# Patient Record
Sex: Female | Born: 1995 | Race: White | Hispanic: No | Marital: Single | Smoking: Never smoker
Health system: Southern US, Community
[De-identification: ages and names within clinical notes are randomized; demographics above are authoritative.]

---

## 2015-02-24 ENCOUNTER — Emergency Department (HOSPITAL_COMMUNITY): Payer: Managed Care, Other (non HMO)

## 2015-02-24 ENCOUNTER — Emergency Department (HOSPITAL_COMMUNITY)
Admission: EM | Admit: 2015-02-24 | Discharge: 2015-02-24 | Disposition: A | Discharge status: Home / Self Care | Payer: Managed Care, Other (non HMO) | Attending: Emergency Medicine | Admitting: Emergency Medicine

## 2015-02-24 ENCOUNTER — Encounter (HOSPITAL_COMMUNITY): Payer: Self-pay | Admitting: *Deleted

## 2015-02-24 DIAGNOSIS — M436 Torticollis: Secondary | ICD-10-CM | POA: Insufficient documentation

## 2015-02-24 DIAGNOSIS — M542 Cervicalgia: Secondary | ICD-10-CM | POA: Diagnosis present

## 2015-02-24 MED ORDER — CYCLOBENZAPRINE HCL 10 MG PO TABS: 10.0000 mg | ORAL_TABLET | Freq: Three times a day (TID) | ORAL | Status: AC | PRN

## 2015-02-24 MED ORDER — CYCLOBENZAPRINE HCL 10 MG PO TABS
10.0000 mg | ORAL_TABLET | Freq: Once | ORAL | Status: AC
  Administered 2015-02-24: 10 mg via ORAL
  Filled 2015-02-24: qty 1

## 2015-02-24 MED ORDER — KETOROLAC TROMETHAMINE 60 MG/2ML IM SOLN
60.0000 mg | Freq: Once | INTRAMUSCULAR | Status: AC
  Administered 2015-02-24: 60 mg via INTRAMUSCULAR
  Filled 2015-02-24: qty 2

## 2015-02-24 NOTE — ED Notes (Signed)
Pt in c/o neck pain, states she woke up with this pain and then tonight while at softball she was swinging a bat and felt something pop, c/o heaviness and tingling to right arm, reports swelling to back of her neck on the right side, c-collar applied in triage, ambulatory

## 2015-02-24 NOTE — Discharge Instructions (Signed)

## 2015-02-24 NOTE — ED Provider Notes (Signed)
CSN: [id number]     Arrival date & time 02/24/15  2107 History   First MD Initiated Contact with Patient 02/24/15 2143     Chief Complaint  Patient presents with  . Neck Pain     (Consider location/radiation/quality/duration/timing/severity/associated sxs/prior Treatment) Patient is a 19 y.o. female presenting with neck injury. The history is provided by the patient.  Neck Injury This is a new problem. The current episode started today. The problem occurs constantly. The problem has been gradually worsening. Associated symptoms include neck pain. Pertinent negatives include no abdominal pain, congestion, coughing, fever, nausea, rash, visual change or weakness. The symptoms are aggravated by twisting. She has tried nothing for the symptoms. The treatment provided no relief.    History reviewed. No pertinent past medical history. History reviewed. No pertinent past surgical history. History reviewed. No pertinent family history. History  Substance Use Topics  . Smoking status: Never Smoker   . Smokeless tobacco: Not on file  . Alcohol Use: Not on file   OB History    No data available     Review of Systems  Constitutional: Negative for fever.  HENT: Negative for congestion.   Respiratory: Negative for cough.   Gastrointestinal: Negative for nausea and abdominal pain.  Musculoskeletal: Positive for neck pain.  Skin: Negative for color change, pallor, rash and wound.  Neurological: Negative for weakness.  All other systems reviewed and are negative.     Allergies  Keflex  Home Medications   Prior to Admission medications   Medication Sig Start Date End Date Taking? Authorizing Provider  cyclobenzaprine (FLEXERIL) 10 MG tablet Take 1 tablet (10 mg total) by mouth 3 (three) times daily as needed for muscle spasms. 02/24/15   Dorna Leitz, MD   BP 110/60 mmHg  Pulse 90  Temp(Src) 98 F (36.7 C) (Oral)  Resp 19  SpO2 100%  LMP 02/13/2015 Physical Exam  Constitutional:  She appears well-developed and well-nourished. No distress.  HENT:  Head: Normocephalic and atraumatic.  Mouth/Throat: No oropharyngeal exudate.  Eyes: EOM are normal. Pupils are equal, round, and reactive to light.  Neck: Normal range of motion. Neck supple. Muscular tenderness (Right) present. No rigidity. No erythema present. No Brudzinski's sign and no Kernig's sign noted.  Spasm of right neck. Head held straight forward. Pain with range of motion to left.  Cardiovascular: Normal rate, regular rhythm, normal heart sounds and intact distal pulses.   Pulmonary/Chest: Effort normal and breath sounds normal. No respiratory distress. She has no wheezes. She has no rales.  Abdominal: She exhibits no distension. There is no tenderness.  Musculoskeletal: Normal range of motion. She exhibits no edema or tenderness.  Lymphadenopathy:    She has no cervical adenopathy.  Skin: Skin is warm and dry. No rash noted. She is not diaphoretic.  Psychiatric: She has a normal mood and affect. Her behavior is normal. Judgment and thought content normal.  Nursing note and vitals reviewed.   ED Course  Procedures (including critical care time) Labs Review Labs Reviewed - No data to display  Imaging Review Dg Cervical Spine Complete  02/24/2015   CLINICAL DATA:  19 year old female with neck injury tonight with cervical pain and tingling to right arm.  EXAM: CERVICAL SPINE  4+ VIEWS  COMPARISON:  None.  FINDINGS: There is no evidence of cervical spine fracture or prevertebral soft tissue swelling. Alignment is normal. No other significant bone abnormalities are identified.  IMPRESSION: Negative cervical spine radiographs.   Electronically Signed  By: Harmon PierJeffrey  Hu M.D.   On: 02/24/2015 22:24     EKG Interpretation None      MDM   Final diagnoses:  Torticollis, acute   19 year old female presents with torticollis. Right-sided neck pain after waking up this morning. It worsened after swinging a bat at  softball at practice earlier today. On exam spasming of the right neck noted. No midline tenderness. Plain films obtained from triage are negative. History completely consistent with torticollis of acute origin. No signs of retropharyngeal or peritonsillar abscess. No fevers or meningeal signs. Will treat with anti-inflammatories and muscle relaxers and student health follow-up.  Dorna LeitzAlex Sulay Brymer, MD 02/25/15 [id number]  Richardean Canalavid H Yao, MD 02/25/15 1213

## 2016-04-04 ENCOUNTER — Emergency Department (HOSPITAL_COMMUNITY)
Admission: EM | Admit: 2016-04-04 | Discharge: 2016-04-04 | Disposition: A | Discharge status: Home / Self Care | Payer: Managed Care, Other (non HMO) | Attending: Emergency Medicine | Admitting: Emergency Medicine

## 2016-04-04 ENCOUNTER — Emergency Department (HOSPITAL_COMMUNITY): Payer: Managed Care, Other (non HMO)

## 2016-04-04 ENCOUNTER — Encounter (HOSPITAL_COMMUNITY): Payer: Self-pay

## 2016-04-04 DIAGNOSIS — J029 Acute pharyngitis, unspecified: Secondary | ICD-10-CM | POA: Insufficient documentation

## 2016-04-04 DIAGNOSIS — R52 Pain, unspecified: Secondary | ICD-10-CM

## 2016-04-04 DIAGNOSIS — R1011 Right upper quadrant pain: Secondary | ICD-10-CM | POA: Diagnosis present

## 2016-04-04 DIAGNOSIS — Z3202 Encounter for pregnancy test, result negative: Secondary | ICD-10-CM | POA: Diagnosis not present

## 2016-04-04 DIAGNOSIS — N390 Urinary tract infection, site not specified: Secondary | ICD-10-CM | POA: Insufficient documentation

## 2016-04-04 DIAGNOSIS — R1010 Upper abdominal pain, unspecified: Secondary | ICD-10-CM

## 2016-04-04 LAB — LIPASE, BLOOD: Lipase: 32 U/L (ref 11–51)

## 2016-04-04 LAB — COMPREHENSIVE METABOLIC PANEL
ALK PHOS: 61 U/L (ref 38–126)
ALT: 18 U/L (ref 14–54)
ANION GAP: 9 (ref 5–15)
AST: 24 U/L (ref 15–41)
Albumin: 4 g/dL (ref 3.5–5.0)
BILIRUBIN TOTAL: 0.4 mg/dL (ref 0.3–1.2)
BUN: 11 mg/dL (ref 6–20)
CALCIUM: 9.2 mg/dL (ref 8.9–10.3)
CO2: 22 mmol/L (ref 22–32)
Chloride: 109 mmol/L (ref 101–111)
Creatinine, Ser: 0.75 mg/dL (ref 0.44–1.00)
GFR calc Af Amer: >60 mL/min (ref >60)
GFR calc non Af Amer: >60 mL/min (ref >60)
Glucose, Bld: 94 mg/dL (ref 65–99)
POTASSIUM: 4.4 mmol/L (ref 3.5–5.1)
Sodium: 140 mmol/L (ref 135–145)
TOTAL PROTEIN: 6.5 g/dL (ref 6.5–8.1)

## 2016-04-04 LAB — I-STAT BETA HCG BLOOD, ED (MC, WL, AP ONLY)

## 2016-04-04 LAB — CBC
HEMATOCRIT: 37.5 % (ref 36.0–46.0)
HEMOGLOBIN: 11.9 g/dL — AB (ref 12.0–15.0)
MCH: 26.9 pg (ref 26.0–34.0)
MCHC: 31.7 g/dL (ref 30.0–36.0)
MCV: 84.8 fL (ref 78.0–100.0)
Platelets: 172 10*3/uL (ref 150–400)
RBC: 4.42 MIL/uL (ref 3.87–5.11)
RDW: 13.3 % (ref 11.5–15.5)
WBC: 5.7 10*3/uL (ref 4.0–10.5)

## 2016-04-04 LAB — URINE MICROSCOPIC-ADD ON

## 2016-04-04 LAB — URINALYSIS, ROUTINE W REFLEX MICROSCOPIC
Bilirubin Urine: NEGATIVE
Glucose, UA: NEGATIVE mg/dL
KETONES UR: 15 mg/dL — AB
Nitrite: NEGATIVE
PH: 6.5 (ref 5.0–8.0)
Protein, ur: NEGATIVE mg/dL
Specific Gravity, Urine: 1.016 (ref 1.005–1.030)

## 2016-04-04 LAB — MONONUCLEOSIS SCREEN: MONO SCREEN: NEGATIVE

## 2016-04-04 MED ORDER — NITROFURANTOIN MONOHYD MACRO 100 MG PO CAPS: 100.0000 mg | ORAL_CAPSULE | Freq: Two times a day (BID) | ORAL | Status: AC

## 2016-04-04 MED ORDER — SODIUM CHLORIDE 0.9 % IV BOLUS (SEPSIS)
1000.0000 mL | Freq: Once | INTRAVENOUS | Status: AC
  Administered 2016-04-04: 1000 mL via INTRAVENOUS

## 2016-04-04 NOTE — ED Provider Notes (Signed)
CSN: [id number]     Arrival date & time 04/04/16  1726 History   First MD Initiated Contact with Patient 04/04/16 1846     Chief Complaint  Patient presents with  . Abdominal Pain  . Sore Throat     (Consider location/radiation/quality/duration/timing/severity/associated sxs/prior Treatment) HPI  20 year old female presents with right-sided abdominal pain. Is mostly right upper quadrant. Has been ongoing for the past 5 days. Started when she started having a sore throat and change in voice. Sore throat is still present. She has also been having nausea with one episode of vomiting when the symptoms started. Is currently on Zofran for the nausea. Pain does worsen in her abdomen when eating. No fevers. No back pain. Pain also worsens whenever she is urinating. Denies any vaginal discharge. Is currently on her menstrual cycle.  History reviewed. No pertinent past medical history. History reviewed. No pertinent past surgical history. No family history on file. Social History  Substance Use Topics  . Smoking status: Never Smoker   . Smokeless tobacco: None  . Alcohol Use: None   OB History    No data available     Review of Systems  Constitutional: Negative for fever.  HENT: Positive for sore throat and voice change.   Gastrointestinal: Positive for nausea and abdominal pain. Negative for vomiting and diarrhea.  Genitourinary: Positive for dysuria.  Musculoskeletal: Negative for back pain.  All other systems reviewed and are negative.     Allergies  Keflex  Home Medications   Prior to Admission medications   Medication Sig Start Date End Date Taking? Authorizing Provider  cyclobenzaprine (FLEXERIL) 10 MG tablet Take 1 tablet (10 mg total) by mouth 3 (three) times daily as needed for muscle spasms. 02/24/15   Dorna LeitzAlex Nickle, MD   BP 116/76 mmHg  Pulse 71  Temp(Src) 98.6 F (37 C) (Oral)  Resp 18  SpO2 100%  LMP 04/02/2016 Physical Exam  Constitutional: She is oriented to  person, place, and time. She appears well-developed and well-nourished.  HENT:  Head: Normocephalic and atraumatic.  Right Ear: External ear normal.  Left Ear: External ear normal.  Nose: Nose normal.  Mouth/Throat: Uvula is midline and oropharynx is clear and moist. No trismus in the jaw. No oropharyngeal exudate, posterior oropharyngeal edema, posterior oropharyngeal erythema or tonsillar abscesses.  Raspy voice  Eyes: Right eye exhibits no discharge. Left eye exhibits no discharge.  Cardiovascular: Normal rate, regular rhythm and normal heart sounds.   Pulmonary/Chest: Effort normal and breath sounds normal.  Abdominal: Soft. There is tenderness (mild) in the right upper quadrant. There is no rigidity, no guarding, no tenderness at McBurney's point and negative Murphy's sign.  Neurological: She is alert and oriented to person, place, and time.  Skin: Skin is warm and dry.  Nursing note and vitals reviewed.   ED Course  Procedures (including critical care time) Labs Review Labs Reviewed  CBC - Abnormal; Notable for the following:    Hemoglobin 11.9 (*)    All other components within normal limits  URINALYSIS, ROUTINE W REFLEX MICROSCOPIC (NOT [facility]) - Abnormal; Notable for the following:    Hgb urine dipstick SMALL (*)    Ketones, ur 15 (*)    Leukocytes, UA SMALL (*)    All other components within normal limits  URINE MICROSCOPIC-ADD ON - Abnormal; Notable for the following:    Squamous Epithelial / LPF 0-5 (*)    Bacteria, UA RARE (*)    All other components within normal  limits  LIPASE, BLOOD  COMPREHENSIVE METABOLIC PANEL  MONONUCLEOSIS SCREEN  I-STAT BETA HCG BLOOD, ED ([facility], [facility], [facility] ONLY)    Imaging Review US Abdomen Complete  04/04/2016  CLINICAL DATA:  Right abdominal pain for 5 days EXAM: ABDOMEN ULTRASOUND COMPLETE COMPARISON:  None. FINDINGS: Gallbladder: No gallbladder wall thickening. Polyp is noted measuring 3.5 mm. Common bile duct: Diameter: 2.7 mm. Liver: No  focal lesion identified. Within normal limits in parenchymal echogenicity. IVC: No abnormality visualized. Pancreas: Visualized portion unremarkable. Spleen: Size and appearance within normal limits. Right Kidney: Length: 11 cm. Echogenicity within normal limits. No mass or hydronephrosis visualized. Left Kidney: Length: 11 cm. Echogenicity within normal limits. No mass or hydronephrosis visualized. Abdominal aorta: No aneurysm visualized. Other findings: None. IMPRESSION: 1. No acute findings. 2. Gallbladder polyp. Electronically Signed   By: Signa Kell M.D.   On: 04/04/2016 20:16   I have personally reviewed and evaluated these images and lab results as part of my medical decision-making.   EKG Interpretation None      MDM   Final diagnoses:  Upper abdominal pain  UTI (lower urinary tract infection)    Patient has slowly worsening upper abdominal pain for about 5 days. Ultrasound is negative. LFTs unremarkable. Unclear exactly why she is having pain. However there is no lower abdominal tenderness and does have very low suspicion for torsion, renal stone, appendicitis, or other acute emergency. She is not vomiting. Her voice is hoarse from her recent laryngitis but no shortness of breath or trouble swallowing. Likely has been having a viral syndrome given her nausea as well. At this point we have discussed not doing a CT scan given radiation risks with expectation of low yield results. Discussed returning if symptoms worsen. Otherwise follow-up with PCP. She does have some dysuria and with a equivocal urine will treat with antibiotics.    Pricilla Loveless, MD 04/05/16 [phone]

## 2016-04-04 NOTE — ED Notes (Signed)
Patient diagnosed with laryngitis on Tuesday and complains of ongoing sore throat.  Now has developed right sided abdominal pain with general malaise x 2 days. Sent from urgent care to be evaluated for mono and /or appendicitis

## 2016-04-04 NOTE — ED Notes (Signed)
EDP at bedside  

## 2016-05-18 IMAGING — DX DG CERVICAL SPINE COMPLETE 4+V
5 series · 5 of 5 positions shown · non-contrast
Comparison: None.

CLINICAL DATA: 18-year-old female with neck injury tonight with
cervical pain and tingling to right arm.

EXAM:
CERVICAL SPINE  4+ VIEWS

[c-spine lat]
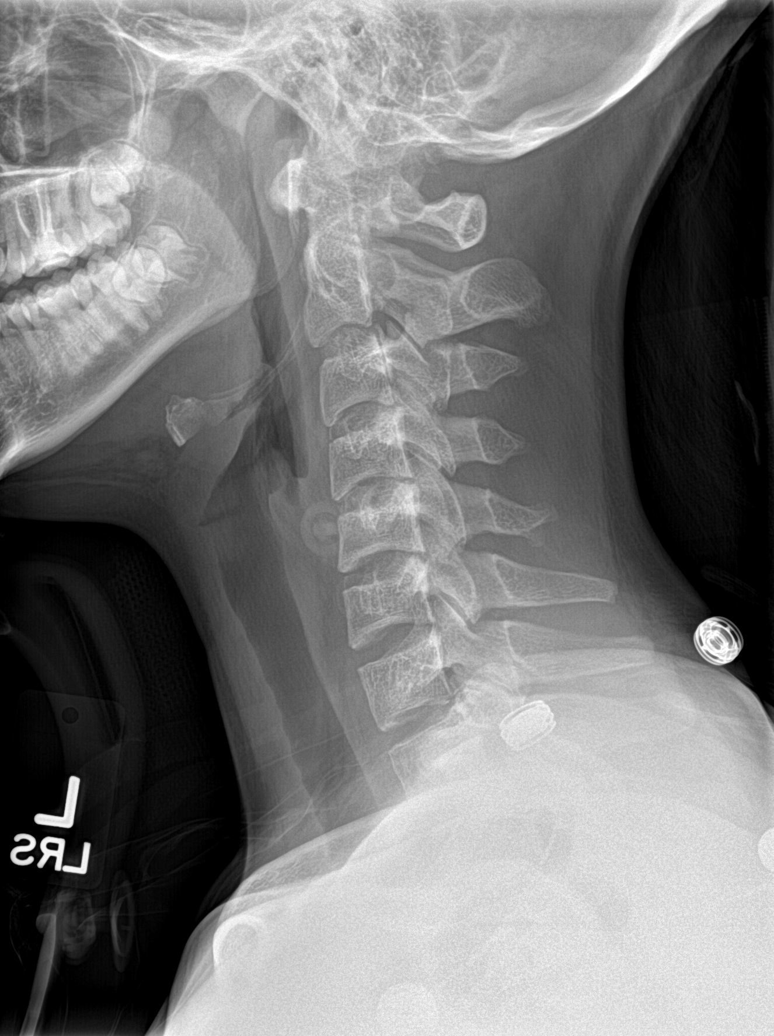

[c-spine obl (1 of 2)]
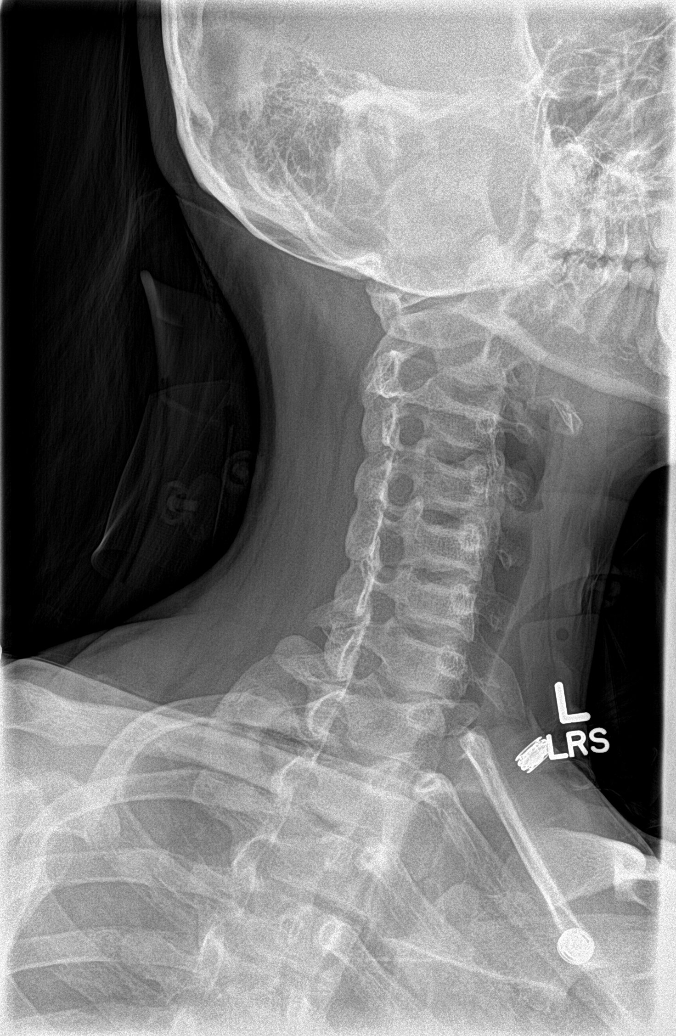

[c-spine obl (2 of 2)]
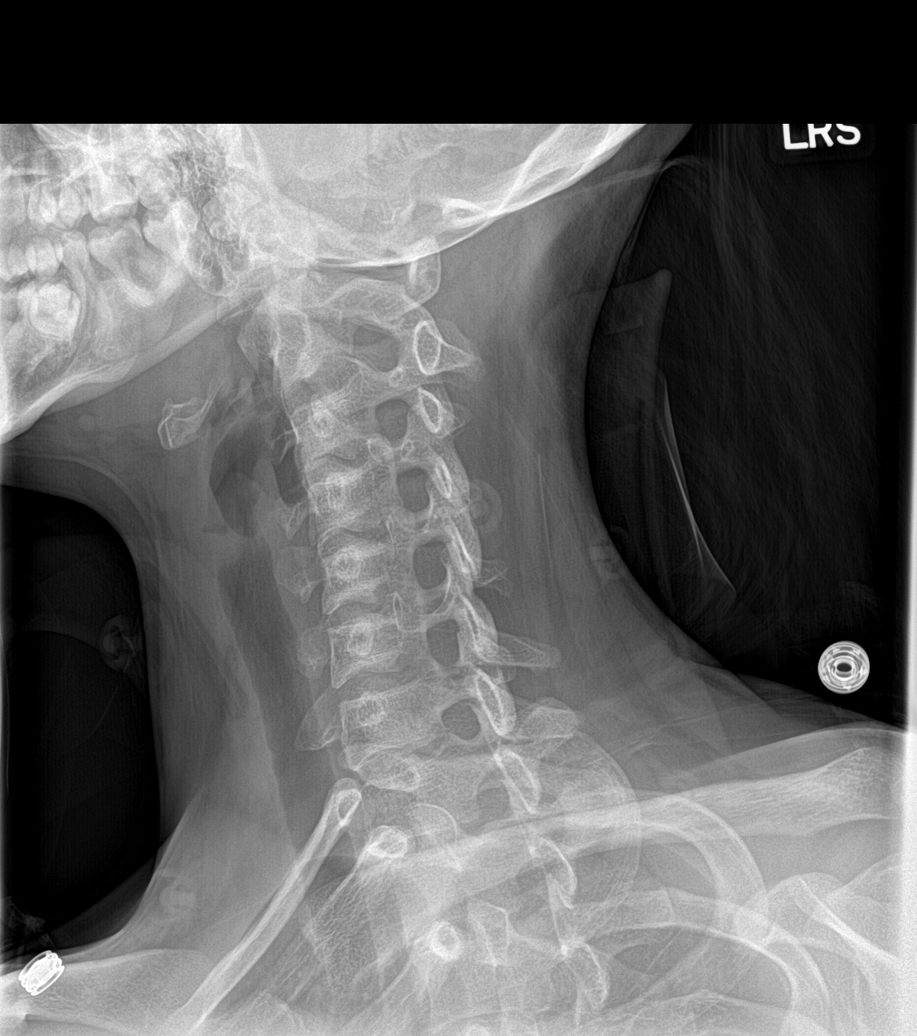

[c-spine ap]
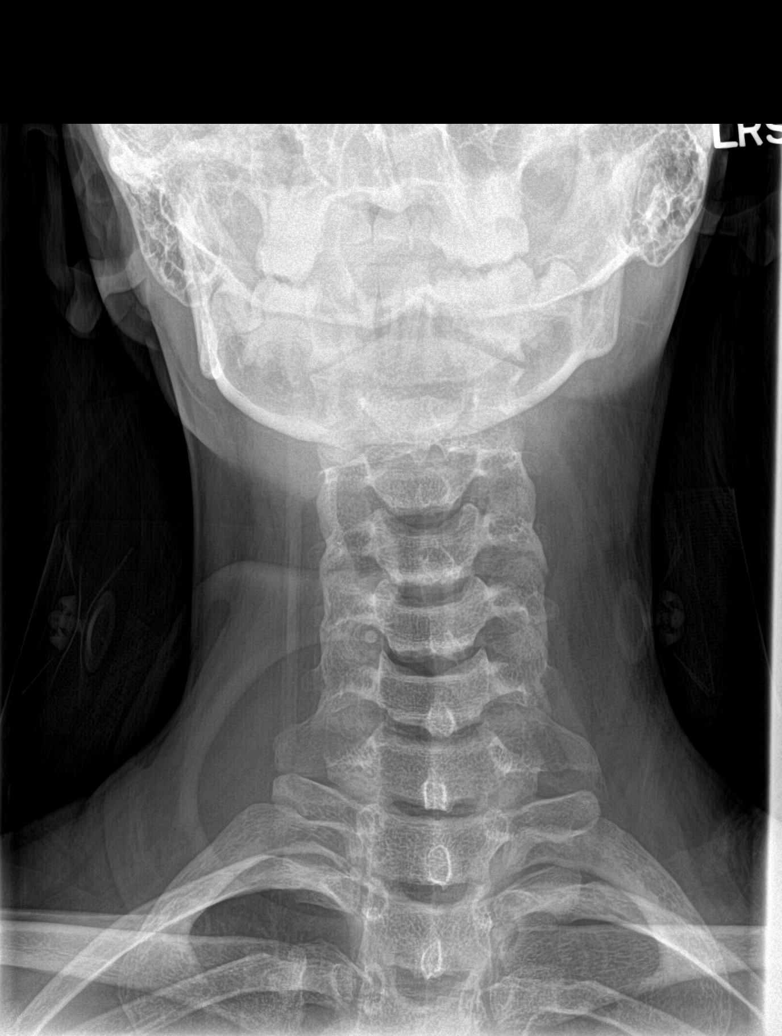

[c-spine open mouth]
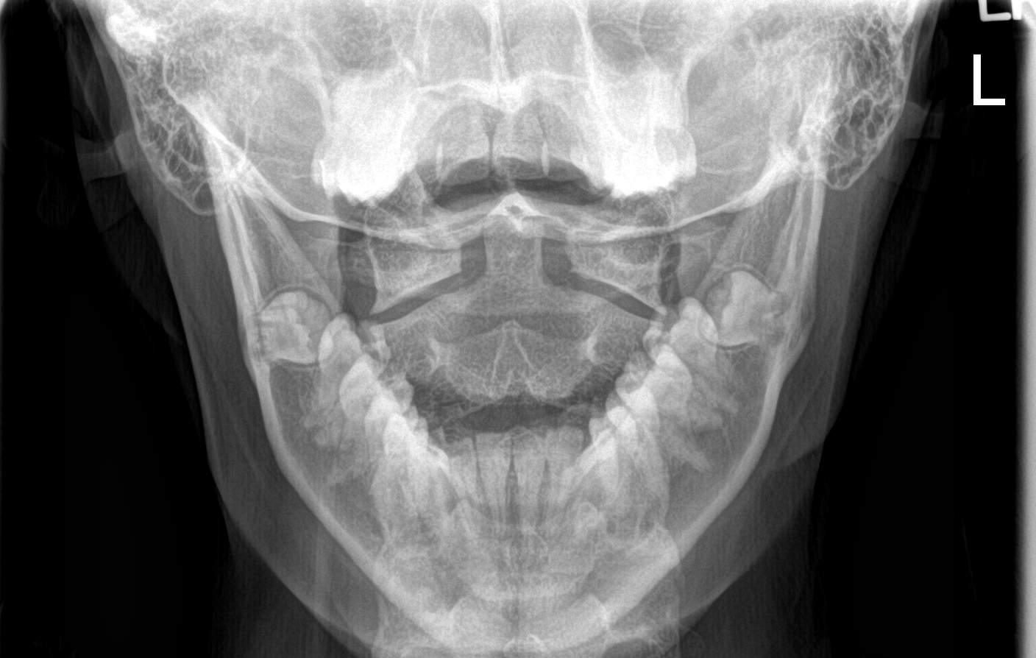

[5 of 5 positions shown; findings below may reference images not displayed]

FINDINGS: There is no evidence of cervical spine fracture or prevertebral soft
tissue swelling. Alignment is normal. No other significant bone
abnormalities are identified.
IMPRESSION: Negative cervical spine radiographs.

## 2017-06-08 IMAGING — US US ABDOMEN COMPLETE
1 series · 14 of 25 positions shown · non-contrast
Comparison: None.

CLINICAL DATA: Right abdominal pain for 5 days

EXAM:
ABDOMEN ULTRASOUND COMPLETE

[Series 1: us abdomen complete · 0.12mm/px · 14 of 76 slices shown]
[im 1/76]
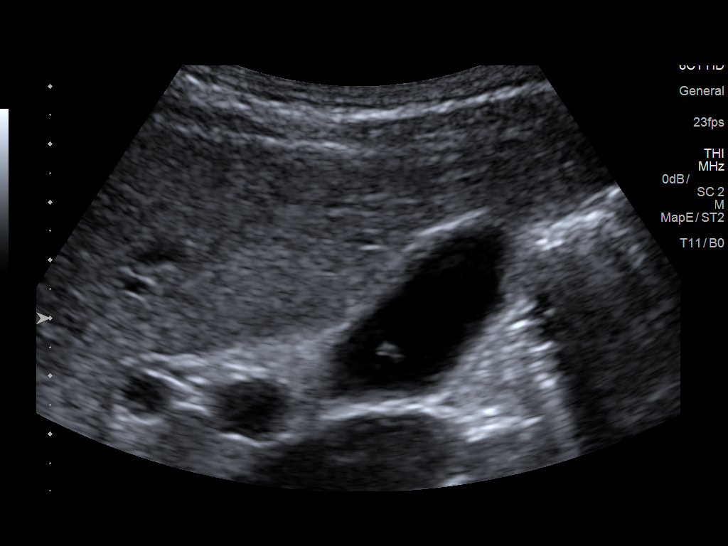
[im 7/76]
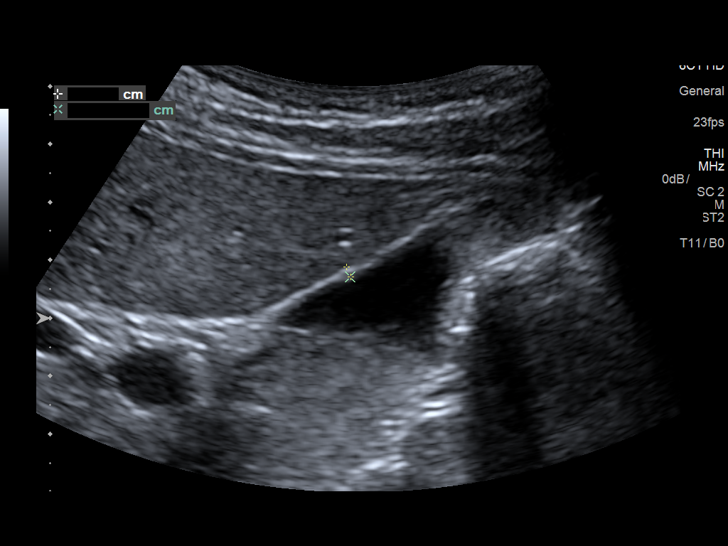
[im 13/76]
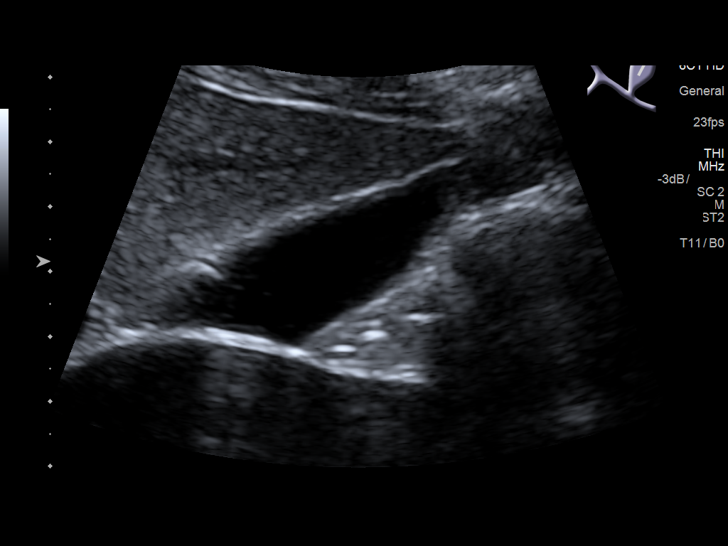
[im 19/76]
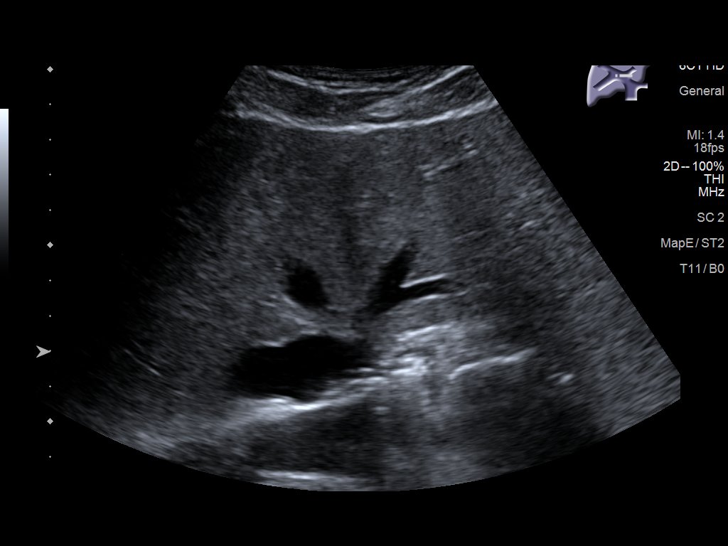
[im 26/76]
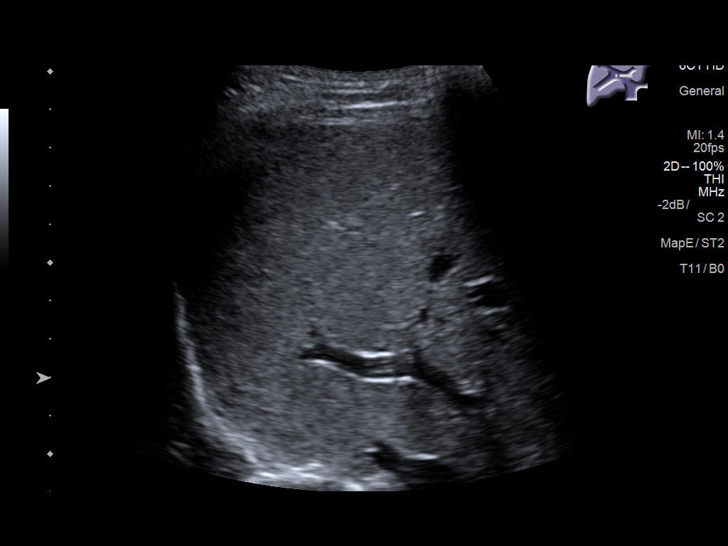
[im 29/76]
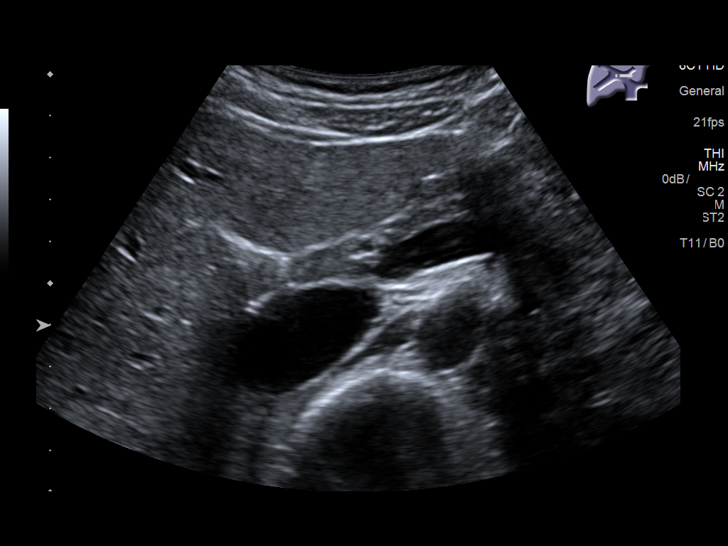
[im 35/76]
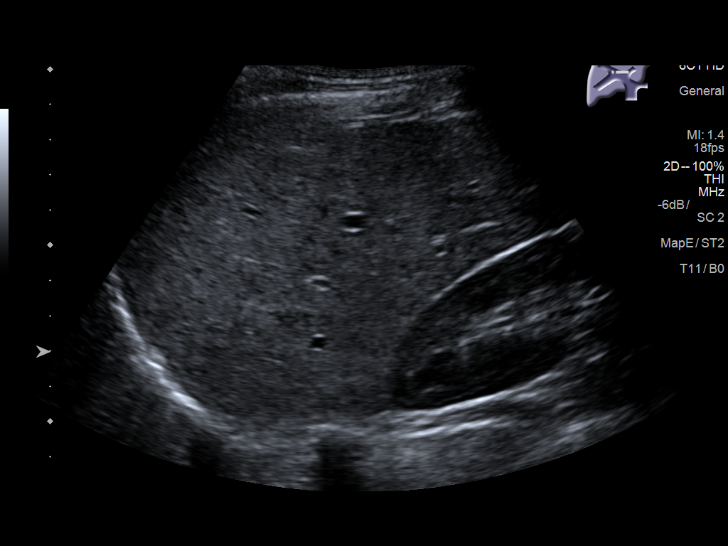
[im 41/76]
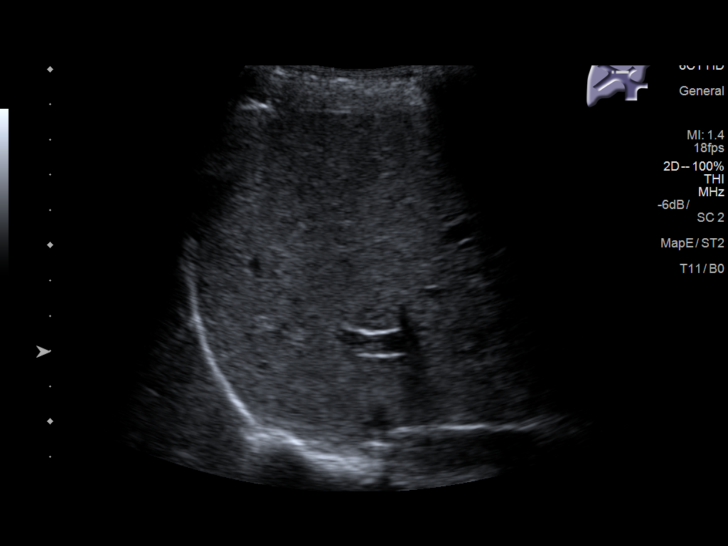
[im 47/76]
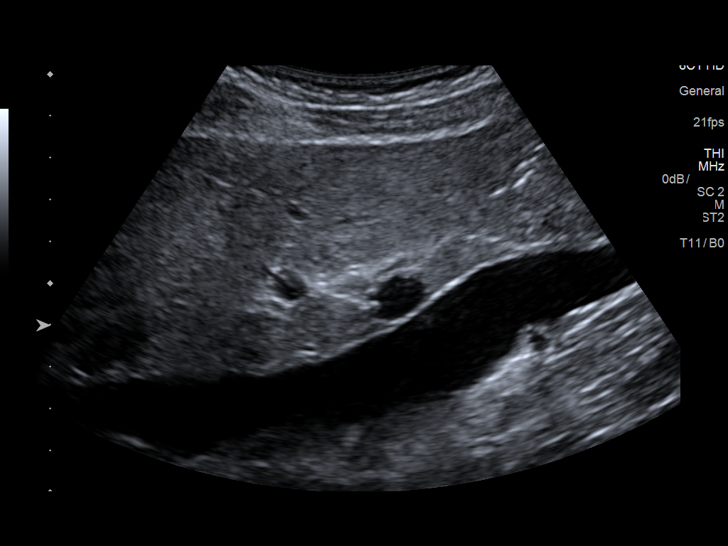
[im 51/76]
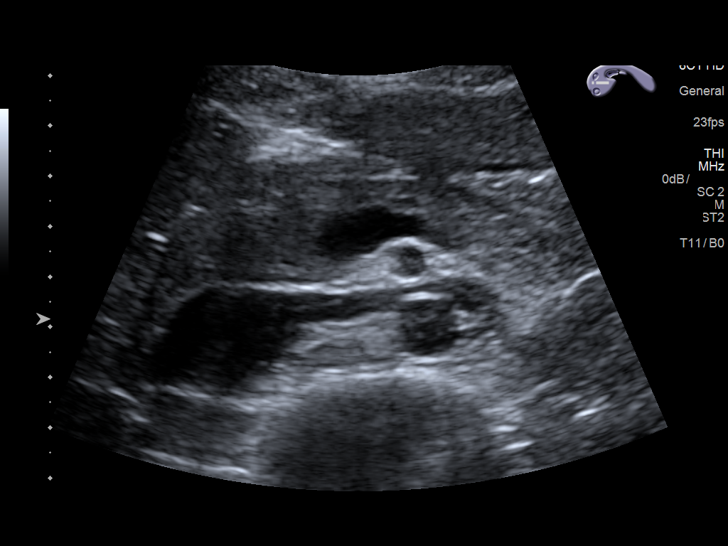
[im 57/76]
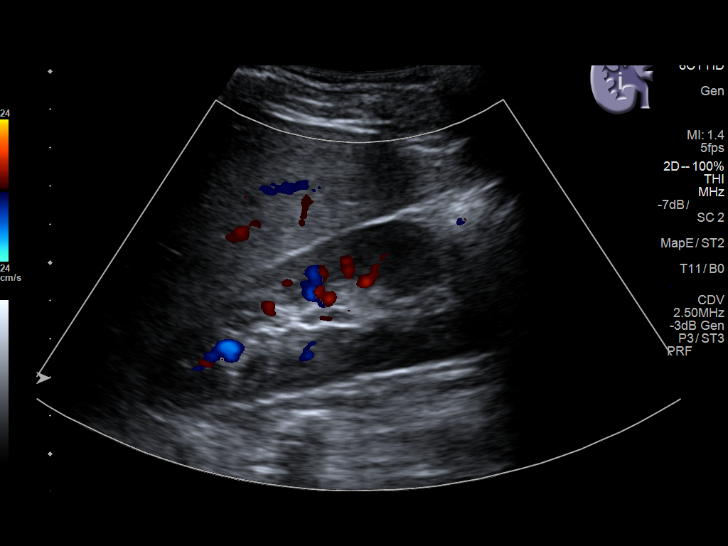
[im 63/76]
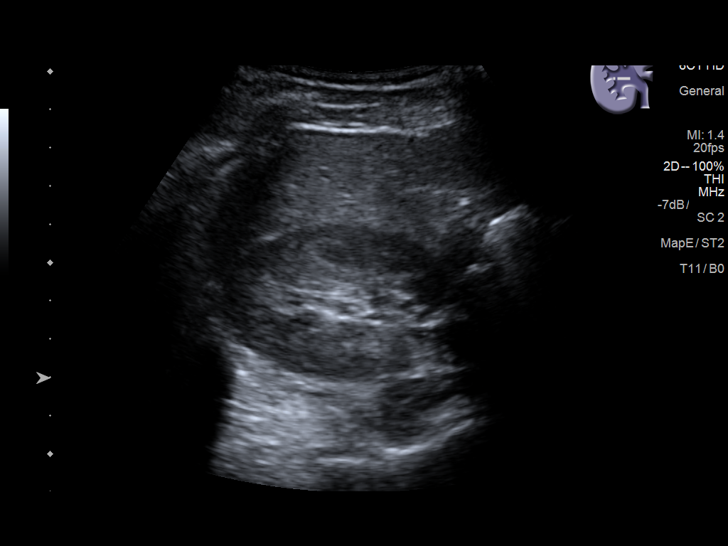
[im 69/76]
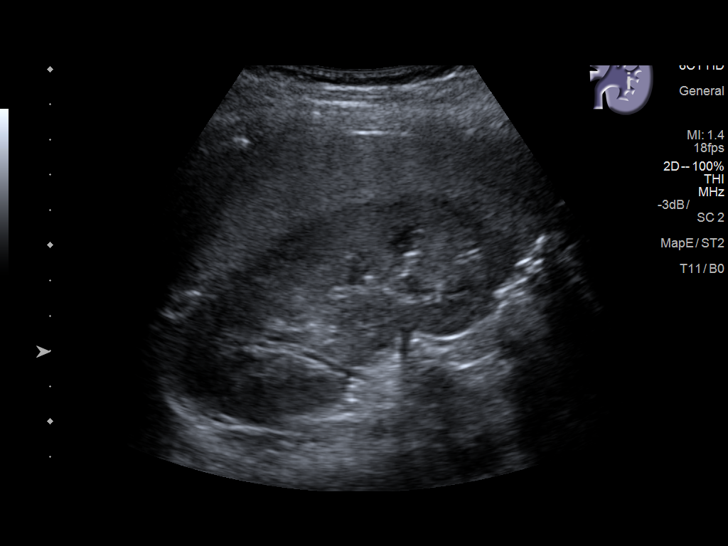
[im 76/76]
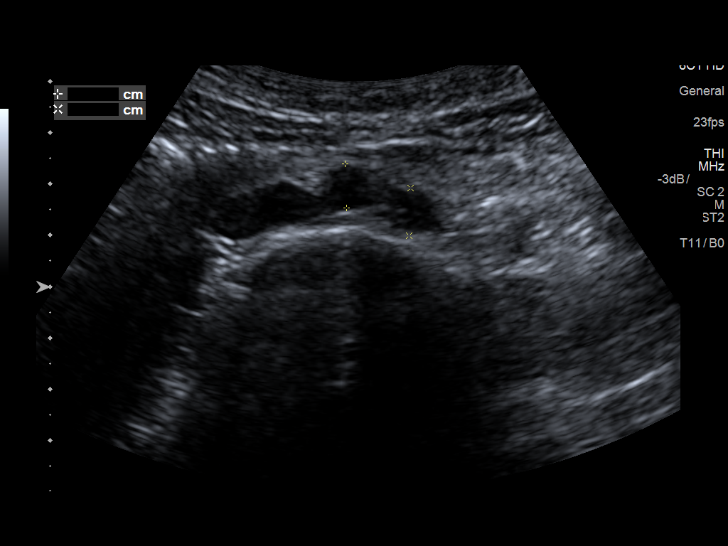

[14 of 25 positions shown; findings below may reference images not displayed]

FINDINGS: Gallbladder: No gallbladder wall thickening. Polyp is noted
measuring 3.5 mm.

Common bile duct: Diameter: 2.7 mm.

Liver: No focal lesion identified. Within normal limits in
parenchymal echogenicity.

IVC: No abnormality visualized.

Pancreas: Visualized portion unremarkable.

Spleen: Size and appearance within normal limits.

Right Kidney: Length: 11 cm. Echogenicity within normal limits. No
mass or hydronephrosis visualized.

Left Kidney: Length: 11 cm. Echogenicity within normal limits. No
mass or hydronephrosis visualized.

Abdominal aorta: No aneurysm visualized.

Other findings: None.
IMPRESSION: 1. No acute findings.
2. Gallbladder polyp.
